# Patient Record
Sex: Male | Born: 1955 | Race: White | Hispanic: No | Marital: Married
Health system: Southern US, Community
[De-identification: ages and names within clinical notes are randomized; demographics above are authoritative.]

## PROBLEM LIST (undated history)

## (undated) DIAGNOSIS — J9621 Acute and chronic respiratory failure with hypoxia: Secondary | ICD-10-CM

## (undated) DIAGNOSIS — J449 Chronic obstructive pulmonary disease, unspecified: Secondary | ICD-10-CM

## (undated) DIAGNOSIS — G4733 Obstructive sleep apnea (adult) (pediatric): Secondary | ICD-10-CM

## (undated) DIAGNOSIS — U071 COVID-19: Secondary | ICD-10-CM

## (undated) DIAGNOSIS — J1282 Pneumonia due to coronavirus disease 2019: Secondary | ICD-10-CM

---

## 2020-05-11 ENCOUNTER — Other Ambulatory Visit (HOSPITAL_COMMUNITY): Payer: Medicare Other

## 2020-05-11 ENCOUNTER — Inpatient Hospital Stay
Admission: AD | Admit: 2020-05-11 | Discharge: 2020-06-09 | Disposition: A | Discharge status: Skilled Nursing Facility | Payer: Medicare Other | Source: Other Acute Inpatient Hospital | Attending: Internal Medicine | Admitting: Internal Medicine

## 2020-05-11 DIAGNOSIS — J449 Chronic obstructive pulmonary disease, unspecified: Secondary | ICD-10-CM | POA: Diagnosis present

## 2020-05-11 DIAGNOSIS — Z4659 Encounter for fitting and adjustment of other gastrointestinal appliance and device: Secondary | ICD-10-CM

## 2020-05-11 DIAGNOSIS — G4733 Obstructive sleep apnea (adult) (pediatric): Secondary | ICD-10-CM | POA: Diagnosis present

## 2020-05-11 DIAGNOSIS — U071 COVID-19: Secondary | ICD-10-CM | POA: Diagnosis present

## 2020-05-11 DIAGNOSIS — J189 Pneumonia, unspecified organism: Secondary | ICD-10-CM

## 2020-05-11 DIAGNOSIS — L0291 Cutaneous abscess, unspecified: Secondary | ICD-10-CM

## 2020-05-11 DIAGNOSIS — J9621 Acute and chronic respiratory failure with hypoxia: Secondary | ICD-10-CM | POA: Diagnosis present

## 2020-05-11 DIAGNOSIS — Z431 Encounter for attention to gastrostomy: Secondary | ICD-10-CM

## 2020-05-11 HISTORY — DX: Acute and chronic respiratory failure with hypoxia: J96.21

## 2020-05-11 HISTORY — DX: Pneumonia due to coronavirus disease 2019: J12.82

## 2020-05-11 HISTORY — DX: COVID-19: U07.1

## 2020-05-11 HISTORY — DX: Chronic obstructive pulmonary disease, unspecified: J44.9

## 2020-05-11 HISTORY — DX: Obstructive sleep apnea (adult) (pediatric): G47.33

## 2020-05-12 ENCOUNTER — Encounter: Payer: Self-pay | Admitting: Internal Medicine

## 2020-05-12 DIAGNOSIS — U071 COVID-19: Secondary | ICD-10-CM | POA: Diagnosis not present

## 2020-05-12 DIAGNOSIS — G4733 Obstructive sleep apnea (adult) (pediatric): Secondary | ICD-10-CM | POA: Diagnosis not present

## 2020-05-12 DIAGNOSIS — J1282 Pneumonia due to coronavirus disease 2019: Secondary | ICD-10-CM

## 2020-05-12 DIAGNOSIS — J449 Chronic obstructive pulmonary disease, unspecified: Secondary | ICD-10-CM | POA: Diagnosis not present

## 2020-05-12 DIAGNOSIS — J9621 Acute and chronic respiratory failure with hypoxia: Secondary | ICD-10-CM | POA: Diagnosis present

## 2020-05-12 LAB — CBC WITH DIFFERENTIAL/PLATELET
Abs Immature Granulocytes: 0.21 10*3/uL — ABNORMAL HIGH (ref 0.00–0.07)
Basophils Absolute: 0 10*3/uL (ref 0.0–0.1)
Basophils Relative: 1 %
Eosinophils Absolute: 0.2 10*3/uL (ref 0.0–0.5)
Eosinophils Relative: 4 %
HCT: 28.1 % — ABNORMAL LOW (ref 39.0–52.0)
Hemoglobin: 8.6 g/dL — ABNORMAL LOW (ref 13.0–17.0)
Immature Granulocytes: 4 %
Lymphocytes Relative: 24 %
Lymphs Abs: 1.4 10*3/uL (ref 0.7–4.0)
MCH: 27.9 pg (ref 26.0–34.0)
MCHC: 30.6 g/dL (ref 30.0–36.0)
MCV: 91.2 fL (ref 80.0–100.0)
Monocytes Absolute: 0.5 10*3/uL (ref 0.1–1.0)
Monocytes Relative: 9 %
Neutro Abs: 3.4 10*3/uL (ref 1.7–7.7)
Neutrophils Relative %: 58 %
Platelets: 225 10*3/uL (ref 150–400)
RBC: 3.08 MIL/uL — ABNORMAL LOW (ref 4.22–5.81)
RDW: 13.6 % (ref 11.5–15.5)
WBC: 5.8 10*3/uL (ref 4.0–10.5)
nRBC: 0 % (ref 0.0–0.2)

## 2020-05-12 LAB — BLOOD GAS, ARTERIAL
Acid-Base Excess: 4 mmol/L — ABNORMAL HIGH (ref 0.0–2.0)
Bicarbonate: 28.3 mmol/L — ABNORMAL HIGH (ref 20.0–28.0)
FIO2: 35
O2 Saturation: 95.6 %
Patient temperature: 37
pCO2 arterial: 44.4 mmHg (ref 32.0–48.0)
pH, Arterial: 7.42 (ref 7.350–7.450)
pO2, Arterial: 74.3 mmHg — ABNORMAL LOW (ref 83.0–108.0)

## 2020-05-12 LAB — COMPREHENSIVE METABOLIC PANEL
ALT: 39 U/L (ref 0–44)
AST: 14 U/L — ABNORMAL LOW (ref 15–41)
Albumin: 2.7 g/dL — ABNORMAL LOW (ref 3.5–5.0)
Alkaline Phosphatase: 44 U/L (ref 38–126)
Anion gap: 8 (ref 5–15)
BUN: 26 mg/dL — ABNORMAL HIGH (ref 8–23)
CO2: 30 mmol/L (ref 22–32)
Calcium: 9.4 mg/dL (ref 8.9–10.3)
Chloride: 104 mmol/L (ref 98–111)
Creatinine, Ser: 1.13 mg/dL (ref 0.61–1.24)
GFR calc Af Amer: >60 mL/min (ref >60)
GFR calc non Af Amer: >60 mL/min (ref >60)
Glucose, Bld: 248 mg/dL — ABNORMAL HIGH (ref 70–99)
Potassium: 4.5 mmol/L (ref 3.5–5.1)
Sodium: 142 mmol/L (ref 135–145)
Total Bilirubin: 0.5 mg/dL (ref 0.3–1.2)
Total Protein: 5.9 g/dL — ABNORMAL LOW (ref 6.5–8.1)

## 2020-05-12 LAB — HEMOGLOBIN A1C
Hgb A1c MFr Bld: 8 % — ABNORMAL HIGH (ref 4.8–5.6)
Mean Plasma Glucose: 182.9 mg/dL

## 2020-05-12 NOTE — Consult Note (Signed)
Pulmonary Critical Care Medicine [facility] [place]  PULMONARY SERVICE  Date of Service: 05/12/2020  PULMONARY CRITICAL CARE Donald Spence  QIW:[id number]  DOB: 10/23/1955   DOA: 05/11/2020  Referring Physician: Carron Curie, MD  HPI: Donald Spence is a 64 y.o. male seen for follow up of Acute on Chronic Respiratory Failure.  Patient has multiple medical problems including obstructive sleep apnea morbid obesity moderate COPD DJD who presented to the hospital because of increasing shortness of breath.  Patient was diagnosed with COVID-19 and admitted to the hospital.  Patient had a complicated course was intubated extubated several times but did not tolerate apparently had up to 4 intubations based on the discharge summary so therefore tracheostomy was done.  We will need to proceed slowly therefore.  Patient also apparently developed sepsis and was treated with vancomycin.  Patient is now transferred to our facility for further management and weaning  Review of Systems:  ROS performed and is unremarkable other than noted above.  Past Medical History:  Diagnosis Date  . Anxiety  . COPD (chronic obstructive pulmonary disease) (*)  . Coronary artery disease  . Diverticulitis  . Hyperlipidemia  . Hypertension  . 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 Sleep apnea  . Type II or unspecified type diabetes mellitus without mention of complication, uncontrolled  History of Diabetes Mellitus Poorly Controlled  . Unspecified nasal polyp  History of Nasal Polyps   Past Surgical History:  Procedure Laterality Date  . Cardiac catheterization  . Cervical fusion 10/16/2012  C3-7 ACDF @ [facility] Dr. Tyrone Spence  . Coronary artery bypass graft 04/11/2018  CABG (CORONARY ARTERY BYPASS GRAFT) x 2, IMA, [facility] - Dr. Belva Spence  . Eye surgery Bilateral  cataracts  . Hemorrhoid surgery  . Left l5-s1 discectomy via metrx Left 02/03/2017  [place], MD @ [facility] (Left L5-s1 discectomy via MetRx)  . Other surgical  history  History of Nose Surgery  . Other surgical history  History of Tonsillectomy  . Other surgical history  History of Coronary Angiography, W/O Concomitant Left Heart Catheteriz  . Tonsillectomy   Social History   Socioeconomic History  . Marital status: Widowed  Spouse name: Not on file  . Number of children: 3  . Years of education: [employer]  . Highest education level: Not on file  Occupational History  . Occupation: physical labor  Tobacco Use  . Smoking status: Never Smoker  . Smokeless tobacco: Current User  . Tobacco comment: chew  Vaping Use  . Vaping Use: Never used  Substance and Sexual Activity  . Alcohol use: Not Currently  . Drug use: Not on file  . Sexual activity: Not on file  Other Topics Concern  . Not on file  Social History Narrative  Marital History - His wife died Friday, 26-Feb-2017 for metastatic carcinoma. She died 5 days after his 16 12/28/16 lumbar surgery.   Family History  Problem Relation Age of Onset  . Other Other  Family history of Hyperlipidemia  . Other Other  Family history of Cancer  . Heart attack Mother  . Cancer Father   Allergies  Allergen Reactions  . Bee Venom Nausea And Vomiting, Swelling and Other  sweating     Medications: Reviewed on Rounds  Physical Exam:  Vitals: Temperature is 98.9 pulse 70 respiratory rate 17 blood pressure is 152/78 saturations 100%  Ventilator Settings on T-bar at this time on wean protocol  . General: Comfortable at this time . Eyes: Grossly  normal lids, irises & conjunctiva . ENT: grossly tongue is normal . Neck: no obvious mass . Cardiovascular: S1-S2 normal no gallop or rub . Respiratory: No rhonchi no rales are noted at this time . Abdomen: Soft nontender . Skin: no rash seen on limited exam . Musculoskeletal: not rigid . Psychiatric:unable to assess . Neurologic: no seizure no involuntary movements         Labs on Admission:  Basic Metabolic Panel: No results for  input(s): NA, K, CL, CO2, GLUCOSE, BUN, CREATININE, CALCIUM, MG, PHOS in the last 168 hours.  No results for input(s): PHART, PCO2ART, PO2ART, HCO3, O2SAT in the last 168 hours.  Liver Function Tests: No results for input(s): AST, ALT, ALKPHOS, BILITOT, PROT, ALBUMIN in the last 168 hours. No results for input(s): LIPASE, AMYLASE in the last 168 hours. No results for input(s): AMMONIA in the last 168 hours.  CBC: No results for input(s): WBC, NEUTROABS, HGB, HCT, MCV, PLT in the last 168 hours.  Cardiac Enzymes: No results for input(s): CKTOTAL, CKMB, CKMBINDEX, TROPONINI in the last 168 hours.  BNP (last 3 results) No results for input(s): BNP in the last 8760 hours.  ProBNP (last 3 results) No results for input(s): PROBNP in the last 8760 hours.   Radiological Exams on Admission: DG CHEST PORT 1 VIEW  Result Date: 05/11/2020 CLINICAL DATA:  COVID positive.  NG placement EXAM: PORTABLE CHEST 1 VIEW COMPARISON:  None. FINDINGS: Tracheostomy in good position. Left arm PICC tip in the right atrium. Prior median sternotomy. Heart size upper normal without heart failure. Mild patchy bilateral airspace disease left greater than right. No significant effusion. IMPRESSION: Mild patchy bibasilar airspace disease left greater than right most likely pneumonia. Tracheostomy in good position. PICC tip in the right atrium. Electronically Signed   By: Donald Spence M.D.   On: 05/11/2020 15:01   DG Abd Portable 1V  Result Date: 05/11/2020 CLINICAL DATA:  Enteric tube placement EXAM: PORTABLE ABDOMEN - 1 VIEW COMPARISON:  None. FINDINGS: Feeding tube courses through the stomach and into the duodenum with the tip in the proximal jejunum. Normal bowel gas pattern. IMPRESSION: Feeding tube tip in the proximal jejunum. Electronically Signed   By: Donald Spence M.D.   On: 05/11/2020 15:02    Assessment/Plan Active Problems:   Acute on chronic respiratory failure with hypoxia (HCC)   COVID-19 virus  infection   Pneumonia due to COVID-19 virus   COPD, severe (HCC)   Obstructive sleep apnea   1. Acute on chronic respiratory failure with hypoxia patient will be continued on the weaning protocol appears to be tolerating it well.  We will continue to advance. 2. COVID-19 virus infection in resolution phase we will continue to monitor closely. 3. COVID-19 pneumonia has been treated we will continue to follow up on chest x-rays.  The patient still has some airspace disease noted as a residual. 4. Severe COPD will need nebulizers as necessary.  We will continue to monitor closely. 5. Sleep apnea nonissue at this time patient is with a tracheostomy but once patient is able to will need Pap therapy  I have personally seen and evaluated the patient, evaluated laboratory and imaging results, formulated the assessment and plan and placed orders. The Patient requires high complexity decision making with multiple systems involvement.  Case was discussed on Rounds with the Respiratory Therapy Director and the Respiratory staff Time Spent  Yevonne Pax, MD [facility] Pulmonary Critical Care Medicine Sleep Medicine

## 2020-05-13 DIAGNOSIS — U071 COVID-19: Secondary | ICD-10-CM | POA: Diagnosis not present

## 2020-05-13 DIAGNOSIS — J449 Chronic obstructive pulmonary disease, unspecified: Secondary | ICD-10-CM | POA: Diagnosis not present

## 2020-05-13 DIAGNOSIS — G4733 Obstructive sleep apnea (adult) (pediatric): Secondary | ICD-10-CM | POA: Diagnosis not present

## 2020-05-13 DIAGNOSIS — J9621 Acute and chronic respiratory failure with hypoxia: Secondary | ICD-10-CM | POA: Diagnosis not present

## 2020-05-13 NOTE — Progress Notes (Addendum)
Pulmonary Critical Care Medicine [facility] [place]   PULMONARY CRITICAL CARE SERVICE  PROGRESS NOTE  Date of Service: 05/13/2020  Donald Spence  [id number]  DOB: 1955/12/28   DOA: 05/11/2020  Referring Physician: Carron Curie, MD  HPI: Donald Spence is a 64 y.o. male seen for follow up of Acute on Chronic Respiratory Failure.  Patient mains on T-bar 35% FiO2 satting well no fever distress.  Medications: Reviewed on Rounds  Physical Exam:  Vitals: Pulse 73 respirations 20 BP 156/80 O2 sat 100% temp 98.2  Ventilator Settings T-bar 35%  . General: Comfortable at this time . Eyes: Grossly normal lids, irises & conjunctiva . ENT: grossly tongue is normal . Neck: no obvious mass . Cardiovascular: S1 S2 normal no gallop . Respiratory: No rales or rhonchi noted . Abdomen: soft . Skin: no rash seen on limited exam . Musculoskeletal: not rigid . Psychiatric:unable to assess . Neurologic: no seizure no involuntary movements         Lab Data:   Basic Metabolic Panel: Recent Labs  Lab 05/12/20 0736  NA 142  K 4.5  CL 104  CO2 30  GLUCOSE 248*  BUN 26*  CREATININE 1.13  CALCIUM 9.4    ABG: Recent Labs  Lab 05/12/20 1505  PHART 7.420  PCO2ART 44.4  PO2ART 74.3*  HCO3 28.3*  O2SAT 95.6    Liver Function Tests: Recent Labs  Lab 05/12/20 0736  AST 14*  ALT 39  ALKPHOS 44  BILITOT 0.5  PROT 5.9*  ALBUMIN 2.7*   No results for input(s): LIPASE, AMYLASE in the last 168 hours. No results for input(s): AMMONIA in the last 168 hours.  CBC: Recent Labs  Lab 05/12/20 0736  WBC 5.8  NEUTROABS 3.4  HGB 8.6*  HCT 28.1*  MCV 91.2  PLT 225    Cardiac Enzymes: No results for input(s): CKTOTAL, CKMB, CKMBINDEX, TROPONINI in the last 168 hours.  BNP (last 3 results) No results for input(s): BNP in the last 8760 hours.  ProBNP (last 3 results) No results for input(s): PROBNP in the last 8760 hours.  Radiological Exams: DG CHEST PORT 1  VIEW  Result Date: 05/11/2020 CLINICAL DATA:  COVID positive.  NG placement EXAM: PORTABLE CHEST 1 VIEW COMPARISON:  None. FINDINGS: Tracheostomy in good position. Left arm PICC tip in the right atrium. Prior median sternotomy. Heart size upper normal without heart failure. Mild patchy bilateral airspace disease left greater than right. No significant effusion. IMPRESSION: Mild patchy bibasilar airspace disease left greater than right most likely pneumonia. Tracheostomy in good position. PICC tip in the right atrium. Electronically Signed   By: Marlan Palau M.D.   On: 05/11/2020 15:01   DG Abd Portable 1V  Result Date: 05/11/2020 CLINICAL DATA:  Enteric tube placement EXAM: PORTABLE ABDOMEN - 1 VIEW COMPARISON:  None. FINDINGS: Feeding tube courses through the stomach and into the duodenum with the tip in the proximal jejunum. Normal bowel gas pattern. IMPRESSION: Feeding tube tip in the proximal jejunum. Electronically Signed   By: Marlan Palau M.D.   On: 05/11/2020 15:02    Assessment/Plan Active Problems:   Acute on chronic respiratory failure with hypoxia (HCC)   COVID-19 virus infection   Pneumonia due to COVID-19 virus   COPD, severe (HCC)   Obstructive sleep apnea   1. Acute on chronic respiratory failure with hypoxia patient will continue to wean as per protocol.  Certainly doing well on 35% aerosol trach collar will continue aggressive pulmonary toilet  supportive measures. 2. COVID-19 virus infection in resolution phase we will continue to monitor closely. 3. COVID-19 pneumonia has been treated we will continue to follow up on chest x-rays.  The patient still has some airspace disease noted as a residual. 4. Severe COPD will need nebulizers as necessary.  We will continue to monitor closely. 5. Sleep apnea nonissue at this time patient is with a tracheostomy but once patient is able to will need Pap therapy   I have personally seen and evaluated the patient, evaluated laboratory  and imaging results, formulated the assessment and plan and placed orders. The Patient requires high complexity decision making with multiple systems involvement.  Rounds were done with the Respiratory Therapy Director and Staff therapists and discussed with nursing staff also.  Yevonne Pax, MD [facility] Pulmonary Critical Care Medicine Sleep Medicine

## 2020-05-14 DIAGNOSIS — G4733 Obstructive sleep apnea (adult) (pediatric): Secondary | ICD-10-CM | POA: Diagnosis not present

## 2020-05-14 DIAGNOSIS — J449 Chronic obstructive pulmonary disease, unspecified: Secondary | ICD-10-CM | POA: Diagnosis not present

## 2020-05-14 DIAGNOSIS — J9621 Acute and chronic respiratory failure with hypoxia: Secondary | ICD-10-CM | POA: Diagnosis not present

## 2020-05-14 DIAGNOSIS — U071 COVID-19: Secondary | ICD-10-CM | POA: Diagnosis not present

## 2020-05-14 NOTE — Progress Notes (Addendum)
Pulmonary Critical Care Medicine [facility] [place]   PULMONARY CRITICAL CARE SERVICE  PROGRESS NOTE  Date of Service: 05/14/2020  Donald Spence  [id number]  DOB: September 11, 1955   DOA: 05/11/2020  Referring Physician: Carron Curie, MD  HPI: Donald Spence is a 64 y.o. male seen for follow up of Acute on Chronic Respiratory Failure.  Patient mains on 35% T-bar satting well no fever distress.  Medications: Reviewed on Rounds  Physical Exam:  Vitals: Pulse 87 respirations 47 BP 134/74 O2 sat 96% temp 98.6  Ventilator Settings T-bar 35%   General: Comfortable at this time  Eyes: Grossly normal lids, irises & conjunctiva  ENT: grossly tongue is normal  Neck: no obvious mass  Cardiovascular: S1 S2 normal no gallop  Respiratory: No rales or rhonchi noted  Abdomen: soft  Skin: no rash seen on limited exam  Musculoskeletal: not rigid  Psychiatric:unable to assess  Neurologic: no seizure no involuntary movements         Lab Data:   Basic Metabolic Panel: Recent Labs  Lab 05/12/20 0736  NA 142  K 4.5  CL 104  CO2 30  GLUCOSE 248*  BUN 26*  CREATININE 1.13  CALCIUM 9.4    ABG: Recent Labs  Lab 05/12/20 1505  PHART 7.420  PCO2ART 44.4  PO2ART 74.3*  HCO3 28.3*  O2SAT 95.6    Liver Function Tests: Recent Labs  Lab 05/12/20 0736  AST 14*  ALT 39  ALKPHOS 44  BILITOT 0.5  PROT 5.9*  ALBUMIN 2.7*   No results for input(s): LIPASE, AMYLASE in the last 168 hours. No results for input(s): AMMONIA in the last 168 hours.  CBC: Recent Labs  Lab 05/12/20 0736  WBC 5.8  NEUTROABS 3.4  HGB 8.6*  HCT 28.1*  MCV 91.2  PLT 225    Cardiac Enzymes: No results for input(s): CKTOTAL, CKMB, CKMBINDEX, TROPONINI in the last 168 hours.  BNP (last 3 results) No results for input(s): BNP in the last 8760 hours.  ProBNP (last 3 results) No results for input(s): PROBNP in the last 8760 hours.  Radiological Exams: No results  found.  Assessment/Plan Active Problems:   Acute on chronic respiratory failure with hypoxia (HCC)   COVID-19 virus infection   Pneumonia due to COVID-19 virus   COPD, severe (HCC)   Obstructive sleep apnea   1. Acute on chronic respiratory failure with hypoxia  plan is for patient to continue to wean on 35% T-bar we will continue supportive measures and pulmonary toilet at this time. 2. COVID-19 virus infection in resolution phase we will continue to monitor closely. 3. COVID-19 pneumonia has been treated we will continue to follow up on chest x-rays. The patient still has some airspace disease noted as a residual. 4. Severe COPD will need nebulizers as necessary. We will continue to monitor closely. 5. Sleep apnea nonissue at this time patient is with a tracheostomy but once patient is able to will need Pap therapy   I have personally seen and evaluated the patient, evaluated laboratory and imaging results, formulated the assessment and plan and placed orders. The Patient requires high complexity decision making with multiple systems involvement.  Rounds were done with the Respiratory Therapy Director and Staff therapists and discussed with nursing staff also.  Yevonne Pax, MD [facility] Pulmonary Critical Care Medicine Sleep Medicine

## 2020-05-15 DIAGNOSIS — J449 Chronic obstructive pulmonary disease, unspecified: Secondary | ICD-10-CM | POA: Diagnosis not present

## 2020-05-15 DIAGNOSIS — U071 COVID-19: Secondary | ICD-10-CM | POA: Diagnosis not present

## 2020-05-15 DIAGNOSIS — J9621 Acute and chronic respiratory failure with hypoxia: Secondary | ICD-10-CM | POA: Diagnosis not present

## 2020-05-15 DIAGNOSIS — G4733 Obstructive sleep apnea (adult) (pediatric): Secondary | ICD-10-CM | POA: Diagnosis not present

## 2020-05-15 NOTE — Progress Notes (Signed)
Pulmonary Critical Care Medicine [facility] [place]   PULMONARY CRITICAL CARE SERVICE  PROGRESS NOTE  Date of Service: 05/15/2020  Donald Spence  [id number]  DOB: Sep 06, 1955   DOA: 05/11/2020  Referring Physician: Carron Curie, MD  HPI: Donald Spence is a 64 y.o. male seen for follow up of Acute on Chronic Respiratory Failure.  Patient is comfortable right now without distress at this time has been on T-bar weans  Medications: Reviewed on Rounds  Physical Exam:  Vitals: Temperature 97.4 pulse 80 respiratory 24 blood pressure is 101/61 saturations 100%  Ventilator Settings on T-bar weans   General: Comfortable at this time  Eyes: Grossly normal lids, irises & conjunctiva  ENT: grossly tongue is normal  Neck: no obvious mass  Cardiovascular: S1 S2 normal no gallop  Respiratory: No rhonchi very coarse breath sounds  Abdomen: soft  Skin: no rash seen on limited exam  Musculoskeletal: not rigid  Psychiatric:unable to assess  Neurologic: no seizure no involuntary movements         Lab Data:   Basic Metabolic Panel: Recent Labs  Lab 05/12/20 0736  NA 142  K 4.5  CL 104  CO2 30  GLUCOSE 248*  BUN 26*  CREATININE 1.13  CALCIUM 9.4    ABG: Recent Labs  Lab 05/12/20 1505  PHART 7.420  PCO2ART 44.4  PO2ART 74.3*  HCO3 28.3*  O2SAT 95.6    Liver Function Tests: Recent Labs  Lab 05/12/20 0736  AST 14*  ALT 39  ALKPHOS 44  BILITOT 0.5  PROT 5.9*  ALBUMIN 2.7*   No results for input(s): LIPASE, AMYLASE in the last 168 hours. No results for input(s): AMMONIA in the last 168 hours.  CBC: Recent Labs  Lab 05/12/20 0736  WBC 5.8  NEUTROABS 3.4  HGB 8.6*  HCT 28.1*  MCV 91.2  PLT 225    Cardiac Enzymes: No results for input(s): CKTOTAL, CKMB, CKMBINDEX, TROPONINI in the last 168 hours.  BNP (last 3 results) No results for input(s): BNP in the last 8760 hours.  ProBNP (last 3 results) No results for input(s): PROBNP in the  last 8760 hours.  Radiological Exams: No results found.  Assessment/Plan Active Problems:   Acute on chronic respiratory failure with hypoxia (HCC)   COVID-19 virus infection   Pneumonia due to COVID-19 virus   COPD, severe (HCC)   Obstructive sleep apnea   1. Acute on chronic respiratory failure with hypoxia we will continue with weaning antibiotics tolerated 2. COVID-19 virus infection in recovery phase 3. Pneumonia due to COVID-19 clinically is improving 4. Severe COPD at baseline 5. Obstructive sleep apnea nonissue at this time on T-bar   I have personally seen and evaluated the patient, evaluated laboratory and imaging results, formulated the assessment and plan and placed orders. The Patient requires high complexity decision making with multiple systems involvement.  Rounds were done with the Respiratory Therapy Director and Staff therapists and discussed with nursing staff also.  Yevonne Pax, MD [facility] Pulmonary Critical Care Medicine Sleep Medicine

## 2020-05-16 DIAGNOSIS — U071 COVID-19: Secondary | ICD-10-CM | POA: Diagnosis not present

## 2020-05-16 DIAGNOSIS — J449 Chronic obstructive pulmonary disease, unspecified: Secondary | ICD-10-CM | POA: Diagnosis not present

## 2020-05-16 DIAGNOSIS — G4733 Obstructive sleep apnea (adult) (pediatric): Secondary | ICD-10-CM | POA: Diagnosis not present

## 2020-05-16 DIAGNOSIS — J9621 Acute and chronic respiratory failure with hypoxia: Secondary | ICD-10-CM | POA: Diagnosis not present

## 2020-05-16 LAB — CBC
HCT: 29.2 % — ABNORMAL LOW (ref 39.0–52.0)
Hemoglobin: 9 g/dL — ABNORMAL LOW (ref 13.0–17.0)
MCH: 28 pg (ref 26.0–34.0)
MCHC: 30.8 g/dL (ref 30.0–36.0)
MCV: 90.7 fL (ref 80.0–100.0)
Platelets: 237 10*3/uL (ref 150–400)
RBC: 3.22 MIL/uL — ABNORMAL LOW (ref 4.22–5.81)
RDW: 13.5 % (ref 11.5–15.5)
WBC: 8.3 10*3/uL (ref 4.0–10.5)
nRBC: 0 % (ref 0.0–0.2)

## 2020-05-16 LAB — BASIC METABOLIC PANEL
Anion gap: 10 (ref 5–15)
BUN: 78 mg/dL — ABNORMAL HIGH (ref 8–23)
CO2: 28 mmol/L (ref 22–32)
Calcium: 9.8 mg/dL (ref 8.9–10.3)
Chloride: 105 mmol/L (ref 98–111)
Creatinine, Ser: 1.82 mg/dL — ABNORMAL HIGH (ref 0.61–1.24)
GFR calc Af Amer: 44 mL/min — ABNORMAL LOW (ref >60)
GFR calc non Af Amer: 38 mL/min — ABNORMAL LOW (ref >60)
Glucose, Bld: 267 mg/dL — ABNORMAL HIGH (ref 70–99)
Potassium: 4.5 mmol/L (ref 3.5–5.1)
Sodium: 143 mmol/L (ref 135–145)

## 2020-05-16 NOTE — Progress Notes (Signed)
Pulmonary Critical Care Medicine [facility] [place]   PULMONARY CRITICAL CARE SERVICE  PROGRESS NOTE  Date of Service: 05/16/2020  Donald Spence  [id number]  DOB: 1956-07-04   DOA: 05/11/2020  Referring Physician: Carron Curie, MD  HPI: Donald Spence is a 64 y.o. male seen for follow up of Acute on Chronic Respiratory Failure.  Patient is on T collar currently on 20% FiO2 should be ready for capping trials  Medications: Reviewed on Rounds  Physical Exam:  Vitals: Temperature 97.6 pulse 85 respiratory 19 blood pressure is 151/68 saturations 96%  Ventilator Settings on T collar FiO2 28%  . General: Comfortable at this time . Eyes: Grossly normal lids, irises & conjunctiva . ENT: grossly tongue is normal . Neck: no obvious mass . Cardiovascular: S1 S2 normal no gallop . Respiratory: No rhonchi very coarse breath sounds . Abdomen: soft . Skin: no rash seen on limited exam . Musculoskeletal: not rigid . Psychiatric:unable to assess . Neurologic: no seizure no involuntary movements         Lab Data:   Basic Metabolic Panel: Recent Labs  Lab 05/12/20 0736 05/16/20 0454  NA 142 143  K 4.5 4.5  CL 104 105  CO2 30 28  GLUCOSE 248* 267*  BUN 26* 78*  CREATININE 1.13 1.82*  CALCIUM 9.4 9.8    ABG: Recent Labs  Lab 05/12/20 1505  PHART 7.420  PCO2ART 44.4  PO2ART 74.3*  HCO3 28.3*  O2SAT 95.6    Liver Function Tests: Recent Labs  Lab 05/12/20 0736  AST 14*  ALT 39  ALKPHOS 44  BILITOT 0.5  PROT 5.9*  ALBUMIN 2.7*   No results for input(s): LIPASE, AMYLASE in the last 168 hours. No results for input(s): AMMONIA in the last 168 hours.  CBC: Recent Labs  Lab 05/12/20 0736 05/16/20 0454  WBC 5.8 8.3  NEUTROABS 3.4  --   HGB 8.6* 9.0*  HCT 28.1* 29.2*  MCV 91.2 90.7  PLT 225 237    Cardiac Enzymes: No results for input(s): CKTOTAL, CKMB, CKMBINDEX, TROPONINI in the last 168 hours.  BNP (last 3 results) No results for input(s): BNP  in the last 8760 hours.  ProBNP (last 3 results) No results for input(s): PROBNP in the last 8760 hours.  Radiological Exams: No results found.  Assessment/Plan Active Problems:   Acute on chronic respiratory failure with hypoxia (HCC)   COVID-19 virus infection   Pneumonia due to COVID-19 virus   COPD, severe (HCC)   Obstructive sleep apnea   1. Acute on chronic respiratory failure with hypoxia plan is to continue to advance the weaning trial capping trials today. 2. COVID-19 virus infection in resolution phase 3. Pneumonia due to COVID-19 resolved 4. Severe COPD medical management 5. Obstructive sleep apnea will need to monitor for desaturations once capping is tried   I have personally seen and evaluated the patient, evaluated laboratory and imaging results, formulated the assessment and plan and placed orders. The Patient requires high complexity decision making with multiple systems involvement.  Rounds were done with the Respiratory Therapy Director and Staff therapists and discussed with nursing staff also.  Yevonne Pax, MD [facility] Pulmonary Critical Care Medicine Sleep Medicine

## 2020-05-17 ENCOUNTER — Other Ambulatory Visit (HOSPITAL_COMMUNITY): Payer: Medicare Other

## 2020-05-17 DIAGNOSIS — J9621 Acute and chronic respiratory failure with hypoxia: Secondary | ICD-10-CM | POA: Diagnosis not present

## 2020-05-17 DIAGNOSIS — J449 Chronic obstructive pulmonary disease, unspecified: Secondary | ICD-10-CM | POA: Diagnosis not present

## 2020-05-17 DIAGNOSIS — U071 COVID-19: Secondary | ICD-10-CM | POA: Diagnosis not present

## 2020-05-17 DIAGNOSIS — G4733 Obstructive sleep apnea (adult) (pediatric): Secondary | ICD-10-CM | POA: Diagnosis not present

## 2020-05-17 NOTE — Progress Notes (Signed)
Pulmonary Critical Care Medicine [facility] [place]   PULMONARY CRITICAL CARE SERVICE  PROGRESS NOTE  Date of Service: 05/17/2020  [name]  [id number]  DOB: [birth year]   DOA: 05/11/2020  Referring Physician: Carron Curie, MD  HPI: Donald Spence is a 64 y.o. male seen for follow up of Acute on Chronic Respiratory Failure.  Patient is doing well with capping on 2 L of oxygen should be ready for decannulation  Medications: Reviewed on Rounds  Physical Exam:  Vitals: Temperature 97.2 pulse 79 respiratory rate 21 blood pressure is 135/74 saturations 100%  Ventilator Settings capping on 2 L  . General: Comfortable at this time . Eyes: Grossly normal lids, irises & conjunctiva . ENT: grossly tongue is normal . Neck: no obvious mass . Cardiovascular: S1 S2 normal no gallop . Respiratory: No rhonchi very coarse breath sounds . Abdomen: soft . Skin: no rash seen on limited exam . Musculoskeletal: not rigid . Psychiatric:unable to assess . Neurologic: no seizure no involuntary movements         Lab Data:   Basic Metabolic Panel: Recent Labs  Lab 05/12/20 0736 05/16/20 0454  NA 142 143  K 4.5 4.5  CL 104 105  CO2 30 28  GLUCOSE 248* 267*  BUN 26* 78*  CREATININE 1.13 1.82*  CALCIUM 9.4 9.8    ABG: Recent Labs  Lab 05/12/20 1505  PHART 7.420  PCO2ART 44.4  PO2ART 74.3*  HCO3 28.3*  O2SAT 95.6    Liver Function Tests: Recent Labs  Lab 05/12/20 0736  AST 14*  ALT 39  ALKPHOS 44  BILITOT 0.5  PROT 5.9*  ALBUMIN 2.7*   No results for input(s): LIPASE, AMYLASE in the last 168 hours. No results for input(s): AMMONIA in the last 168 hours.  CBC: Recent Labs  Lab 05/12/20 0736 05/16/20 0454  WBC 5.8 8.3  NEUTROABS 3.4  --   HGB 8.6* 9.0*  HCT 28.1* 29.2*  MCV 91.2 90.7  PLT 225 237    Cardiac Enzymes: No results for input(s): CKTOTAL, CKMB, CKMBINDEX, TROPONINI in the last 168 hours.  BNP (last 3 results) No results for  input(s): BNP in the last 8760 hours.  ProBNP (last 3 results) No results for input(s): PROBNP in the last 8760 hours.  Radiological Exams: No results found.  Assessment/Plan Active Problems:   Acute on chronic respiratory failure with hypoxia (HCC)   COVID-19 virus infection   Pneumonia due to COVID-19 virus   COPD, severe (HCC)   Obstructive sleep apnea   1. Acute on chronic respiratory failure hypoxia we will continue with capping towards eventual decannulation. 2. COVID-19 virus infection in resolution 3. Pneumonia due to COVID-19 treated we will continue to monitor 4. Severe COPD medical management 5. Obstructive sleep apnea he has been doing well actually with the capping   I have personally seen and evaluated the patient, evaluated laboratory and imaging results, formulated the assessment and plan and placed orders. The Patient requires high complexity decision making with multiple systems involvement.  Rounds were done with the Respiratory Therapy Director and Staff therapists and discussed with nursing staff also.  Yevonne Pax, MD [facility] Pulmonary Critical Care Medicine Sleep Medicine

## 2020-05-18 DIAGNOSIS — G4733 Obstructive sleep apnea (adult) (pediatric): Secondary | ICD-10-CM | POA: Diagnosis not present

## 2020-05-18 DIAGNOSIS — U071 COVID-19: Secondary | ICD-10-CM | POA: Diagnosis not present

## 2020-05-18 DIAGNOSIS — J449 Chronic obstructive pulmonary disease, unspecified: Secondary | ICD-10-CM | POA: Diagnosis not present

## 2020-05-18 DIAGNOSIS — J9621 Acute and chronic respiratory failure with hypoxia: Secondary | ICD-10-CM | POA: Diagnosis not present

## 2020-05-18 NOTE — Progress Notes (Signed)
Pulmonary Critical Care Medicine [facility] [place]   PULMONARY CRITICAL CARE SERVICE  PROGRESS NOTE  Date of Service: 05/18/2020  Donald Spence  [id number]:[id number]  DOB: 1955-09-30   DOA: 05/11/2020  Referring Physician: Carron Curie, MD  HPI: Donald Spence is a 64 y.o. male seen for follow up of Acute on Chronic Respiratory Failure.  Patient currently is capping has been doing fairly well.  He appears to be in no distress  Medications: Reviewed on Rounds  Physical Exam:  Vitals: Temperature is 98.5 pulse 77 respiratory 25 blood pressure is 105/72 saturations 98%  Ventilator Settings capping off the ventilator  . General: Comfortable at this time . Eyes: Grossly normal lids, irises & conjunctiva . ENT: grossly tongue is normal . Neck: no obvious mass . Cardiovascular: S1 S2 normal no gallop . Respiratory: No rhonchi no rales are noted at this time . Abdomen: soft . Skin: no rash seen on limited exam . Musculoskeletal: not rigid . Psychiatric:unable to assess . Neurologic: no seizure no involuntary movements         Lab Data:   Basic Metabolic Panel: Recent Labs  Lab 05/12/20 0736 05/16/20 0454  NA 142 143  K 4.5 4.5  CL 104 105  CO2 30 28  GLUCOSE 248* 267*  BUN 26* 78*  CREATININE 1.13 1.82*  CALCIUM 9.4 9.8    ABG: Recent Labs  Lab 05/12/20 1505  PHART 7.420  PCO2ART 44.4  PO2ART 74.3*  HCO3 28.3*  O2SAT 95.6    Liver Function Tests: Recent Labs  Lab 05/12/20 0736  AST 14*  ALT 39  ALKPHOS 44  BILITOT 0.5  PROT 5.9*  ALBUMIN 2.7*   No results for input(s): LIPASE, AMYLASE in the last 168 hours. No results for input(s): AMMONIA in the last 168 hours.  CBC: Recent Labs  Lab 05/12/20 0736 05/16/20 0454  WBC 5.8 8.3  NEUTROABS 3.4  --   HGB 8.6* 9.0*  HCT 28.1* 29.2*  MCV 91.2 90.7  PLT 225 237    Cardiac Enzymes: No results for input(s): CKTOTAL, CKMB, CKMBINDEX, TROPONINI in the last 168 hours.  BNP (last 3 results) No  results for input(s): BNP in the last 8760 hours.  ProBNP (last 3 results) No results for input(s): PROBNP in the last 8760 hours.  Radiological Exams: No results found.  Assessment/Plan Active Problems:   Acute on chronic respiratory failure with hypoxia (HCC)   COVID-19 virus infection   Pneumonia due to COVID-19 virus   COPD, severe (HCC)   Obstructive sleep apnea   1. Acute on chronic respiratory failure hypoxia plan is to proceed with decannulation tomorrow 2. COVID-19 virus infection resolving continue with supportive care 3. Pneumonia due to COVID-19 treated clinically improved 4. Severe COPD at baseline medical management 5. Obstructive sleep apnea may need positive airway pressure we will continue to monitor closely   I have personally seen and evaluated the patient, evaluated laboratory and imaging results, formulated the assessment and plan and placed orders. The Patient requires high complexity decision making with multiple systems involvement.  Rounds were done with the Respiratory Therapy Director and Staff therapists and discussed with nursing staff also.  Yevonne Pax, MD [facility] Pulmonary Critical Care Medicine Sleep Medicine

## 2020-05-19 DIAGNOSIS — J449 Chronic obstructive pulmonary disease, unspecified: Secondary | ICD-10-CM | POA: Diagnosis not present

## 2020-05-19 DIAGNOSIS — J9621 Acute and chronic respiratory failure with hypoxia: Secondary | ICD-10-CM | POA: Diagnosis not present

## 2020-05-19 DIAGNOSIS — U071 COVID-19: Secondary | ICD-10-CM | POA: Diagnosis not present

## 2020-05-19 DIAGNOSIS — G4733 Obstructive sleep apnea (adult) (pediatric): Secondary | ICD-10-CM | POA: Diagnosis not present

## 2020-05-19 LAB — CBC
HCT: 27.7 % — ABNORMAL LOW (ref 39.0–52.0)
Hemoglobin: 9.1 g/dL — ABNORMAL LOW (ref 13.0–17.0)
MCH: 29.4 pg (ref 26.0–34.0)
MCHC: 32.9 g/dL (ref 30.0–36.0)
MCV: 89.6 fL (ref 80.0–100.0)
Platelets: 206 10*3/uL (ref 150–400)
RBC: 3.09 MIL/uL — ABNORMAL LOW (ref 4.22–5.81)
RDW: 13.7 % (ref 11.5–15.5)
WBC: 5.9 10*3/uL (ref 4.0–10.5)
nRBC: 0 % (ref 0.0–0.2)

## 2020-05-19 LAB — BASIC METABOLIC PANEL
Anion gap: 8 (ref 5–15)
BUN: 58 mg/dL — ABNORMAL HIGH (ref 8–23)
CO2: 28 mmol/L (ref 22–32)
Calcium: 8.8 mg/dL — ABNORMAL LOW (ref 8.9–10.3)
Chloride: 100 mmol/L (ref 98–111)
Creatinine, Ser: 1.57 mg/dL — ABNORMAL HIGH (ref 0.61–1.24)
GFR calc Af Amer: 53 mL/min — ABNORMAL LOW (ref >60)
GFR calc non Af Amer: 46 mL/min — ABNORMAL LOW (ref >60)
Glucose, Bld: 215 mg/dL — ABNORMAL HIGH (ref 70–99)
Potassium: 4.7 mmol/L (ref 3.5–5.1)
Sodium: 136 mmol/L (ref 135–145)

## 2020-05-19 NOTE — Progress Notes (Signed)
Pulmonary Critical Care Medicine [facility] [place]   PULMONARY CRITICAL CARE SERVICE  PROGRESS NOTE  Date of Service: 05/19/2020  Donald Spence  [id number]  DOB: 1955-12-09   DOA: 05/11/2020  Referring Physician: Carron Curie, MD  HPI: Donald Spence is a 64 y.o. male seen for follow up of Acute on Chronic Respiratory Failure.  Patient is doing well today ready for decannulation  Medications: Reviewed on Rounds  Physical Exam:  Vitals: Temperature is 97.7 pulse 74 respiratory 23 blood pressure is 127/72 saturations 99%  Ventilator Settings decannulate today  . General: Comfortable at this time . Eyes: Grossly normal lids, irises & conjunctiva . ENT: grossly tongue is normal . Neck: no obvious mass . Cardiovascular: S1 S2 normal no gallop . Respiratory: No rhonchi no rales . Abdomen: soft . Skin: no rash seen on limited exam . Musculoskeletal: not rigid . Psychiatric:unable to assess . Neurologic: no seizure no involuntary movements         Lab Data:   Basic Metabolic Panel: Recent Labs  Lab 05/16/20 0454  NA 143  K 4.5  CL 105  CO2 28  GLUCOSE 267*  BUN 78*  CREATININE 1.82*  CALCIUM 9.8    ABG: Recent Labs  Lab 05/12/20 1505  PHART 7.420  PCO2ART 44.4  PO2ART 74.3*  HCO3 28.3*  O2SAT 95.6    Liver Function Tests: No results for input(s): AST, ALT, ALKPHOS, BILITOT, PROT, ALBUMIN in the last 168 hours. No results for input(s): LIPASE, AMYLASE in the last 168 hours. No results for input(s): AMMONIA in the last 168 hours.  CBC: Recent Labs  Lab 05/16/20 0454  WBC 8.3  HGB 9.0*  HCT 29.2*  MCV 90.7  PLT 237    Cardiac Enzymes: No results for input(s): CKTOTAL, CKMB, CKMBINDEX, TROPONINI in the last 168 hours.  BNP (last 3 results) No results for input(s): BNP in the last 8760 hours.  ProBNP (last 3 results) No results for input(s): PROBNP in the last 8760 hours.  Radiological Exams: No results  found.  Assessment/Plan Active Problems:   Acute on chronic respiratory failure with hypoxia (HCC)   COVID-19 virus infection   Pneumonia due to COVID-19 virus   COPD, severe (HCC)   Obstructive sleep apnea   1. Acute on chronic respiratory failure hypoxia plan is to proceed to decannulation as discussed. 2. COVID-19 virus infection in recovery continue supportive care 3. Pneumonia due to COVID-19 treated clinically improved 4. Severe COPD at baseline 5. Obstructive sleep apnea with we will need to monitor closely for any desaturations   I have personally seen and evaluated the patient, evaluated laboratory and imaging results, formulated the assessment and plan and placed orders. The Patient requires high complexity decision making with multiple systems involvement.  Rounds were done with the Respiratory Therapy Director and Staff therapists and discussed with nursing staff also.  Yevonne Pax, MD [facility] Pulmonary Critical Care Medicine Sleep Medicine

## 2020-05-20 DIAGNOSIS — U071 COVID-19: Secondary | ICD-10-CM | POA: Diagnosis not present

## 2020-05-20 DIAGNOSIS — J9621 Acute and chronic respiratory failure with hypoxia: Secondary | ICD-10-CM | POA: Diagnosis not present

## 2020-05-20 DIAGNOSIS — G4733 Obstructive sleep apnea (adult) (pediatric): Secondary | ICD-10-CM | POA: Diagnosis not present

## 2020-05-20 DIAGNOSIS — J449 Chronic obstructive pulmonary disease, unspecified: Secondary | ICD-10-CM | POA: Diagnosis not present

## 2020-05-20 LAB — BASIC METABOLIC PANEL
Anion gap: 7 (ref 5–15)
BUN: 43 mg/dL — ABNORMAL HIGH (ref 8–23)
CO2: 27 mmol/L (ref 22–32)
Calcium: 8.8 mg/dL — ABNORMAL LOW (ref 8.9–10.3)
Chloride: 100 mmol/L (ref 98–111)
Creatinine, Ser: 1.43 mg/dL — ABNORMAL HIGH (ref 0.61–1.24)
GFR calc Af Amer: 59 mL/min — ABNORMAL LOW (ref >60)
GFR calc non Af Amer: 51 mL/min — ABNORMAL LOW (ref >60)
Glucose, Bld: 250 mg/dL — ABNORMAL HIGH (ref 70–99)
Potassium: 4.7 mmol/L (ref 3.5–5.1)
Sodium: 134 mmol/L — ABNORMAL LOW (ref 135–145)

## 2020-05-20 LAB — CBC
HCT: 25.6 % — ABNORMAL LOW (ref 39.0–52.0)
Hemoglobin: 8.6 g/dL — ABNORMAL LOW (ref 13.0–17.0)
MCH: 29.7 pg (ref 26.0–34.0)
MCHC: 33.6 g/dL (ref 30.0–36.0)
MCV: 88.3 fL (ref 80.0–100.0)
Platelets: 178 10*3/uL (ref 150–400)
RBC: 2.9 MIL/uL — ABNORMAL LOW (ref 4.22–5.81)
RDW: 13.5 % (ref 11.5–15.5)
WBC: 6.3 10*3/uL (ref 4.0–10.5)
nRBC: 0 % (ref 0.0–0.2)

## 2020-05-20 NOTE — Progress Notes (Addendum)
Pulmonary Critical Care Medicine [facility] [place]   PULMONARY CRITICAL CARE SERVICE  PROGRESS NOTE  Date of Service: 05/20/2020  Donald Spence  [id number]  DOB: 1955/12/28   DOA: 05/11/2020  Referring Physician: Carron Curie, MD  HPI: Donald Spence is a 64 y.o. male seen for follow up of Acute on Chronic Respiratory Failure.  Patient remains decannulated on room air satting well no fever distress.  Medications: Reviewed on Rounds  Physical Exam:  Vitals: Pulse 85 respirations 22 BP 131/77 O2 sat 98% temp 98.0  Ventilator Settings room air   General: Comfortable at this time  Eyes: Grossly normal lids, irises & conjunctiva  ENT: grossly tongue is normal  Neck: no obvious mass  Cardiovascular: S1 S2 normal no gallop  Respiratory: No rales or rhonchi noted  Abdomen: soft  Skin: no rash seen on limited exam  Musculoskeletal: not rigid  Psychiatric:unable to assess  Neurologic: no seizure no involuntary movements         Lab Data:   Basic Metabolic Panel: Recent Labs  Lab 05/16/20 0454 05/19/20 1014 05/20/20 1329  NA 143 136 134*  K 4.5 4.7 4.7  CL 105 100 100  CO2 28 28 27   GLUCOSE 267* 215* 250*  BUN 78* 58* 43*  CREATININE 1.82* 1.57* 1.43*  CALCIUM 9.8 8.8* 8.8*    ABG: No results for input(s): PHART, PCO2ART, PO2ART, HCO3, O2SAT in the last 168 hours.  Liver Function Tests: No results for input(s): AST, ALT, ALKPHOS, BILITOT, PROT, ALBUMIN in the last 168 hours. No results for input(s): LIPASE, AMYLASE in the last 168 hours. No results for input(s): AMMONIA in the last 168 hours.  CBC: Recent Labs  Lab 05/16/20 0454 05/19/20 1014 05/20/20 1329  WBC 8.3 5.9 6.3  HGB 9.0* 9.1* 8.6*  HCT 29.2* 27.7* 25.6*  MCV 90.7 89.6 88.3  PLT 237 206 178    Cardiac Enzymes: No results for input(s): CKTOTAL, CKMB, CKMBINDEX, TROPONINI in the last 168 hours.  BNP (last 3 results) No results for input(s): BNP in the last 8760  hours.  ProBNP (last 3 results) No results for input(s): PROBNP in the last 8760 hours.  Radiological Exams: No results found.  Assessment/Plan Active Problems:   Acute on chronic respiratory failure with hypoxia (HCC)   COVID-19 virus infection   Pneumonia due to COVID-19 virus   COPD, severe (HCC)   Obstructive sleep apnea   1. Acute on chronic respiratory failure hypoxia patient is decannulated on room air currently we will continue supportive measures. 2. COVID-19 virus infection in recovery continue supportive care 3. Pneumonia due to COVID-19 treated clinically improved 4. Severe COPD at baseline 5. Obstructive sleep apnea with we will need to monitor closely for any desaturations   I have personally seen and evaluated the patient, evaluated laboratory and imaging results, formulated the assessment and plan and placed orders. The Patient requires high complexity decision making with multiple systems involvement.  Rounds were done with the Respiratory Therapy Director and Staff therapists and discussed with nursing staff also.  05/22/20, MD [facility] Pulmonary Critical Care Medicine Sleep Medicine

## 2020-05-22 LAB — URINALYSIS, ROUTINE W REFLEX MICROSCOPIC
Bilirubin Urine: NEGATIVE
Glucose, UA: NEGATIVE mg/dL
Hgb urine dipstick: NEGATIVE
Ketones, ur: NEGATIVE mg/dL
Leukocytes,Ua: NEGATIVE
Nitrite: NEGATIVE
Protein, ur: NEGATIVE mg/dL
Specific Gravity, Urine: 1.008 (ref 1.005–1.030)
pH: 5 (ref 5.0–8.0)

## 2020-05-22 LAB — GRAM STAIN

## 2020-05-23 LAB — CBC
HCT: 25 % — ABNORMAL LOW (ref 39.0–52.0)
Hemoglobin: 8.1 g/dL — ABNORMAL LOW (ref 13.0–17.0)
MCH: 28.3 pg (ref 26.0–34.0)
MCHC: 32.4 g/dL (ref 30.0–36.0)
MCV: 87.4 fL (ref 80.0–100.0)
Platelets: 168 10*3/uL (ref 150–400)
RBC: 2.86 MIL/uL — ABNORMAL LOW (ref 4.22–5.81)
RDW: 14.1 % (ref 11.5–15.5)
WBC: 7.3 10*3/uL (ref 4.0–10.5)
nRBC: 0 % (ref 0.0–0.2)

## 2020-05-23 LAB — BASIC METABOLIC PANEL
Anion gap: 7 (ref 5–15)
BUN: 52 mg/dL — ABNORMAL HIGH (ref 8–23)
CO2: 28 mmol/L (ref 22–32)
Calcium: 9.5 mg/dL (ref 8.9–10.3)
Chloride: 100 mmol/L (ref 98–111)
Creatinine, Ser: 1.38 mg/dL — ABNORMAL HIGH (ref 0.61–1.24)
GFR calc Af Amer: >60 mL/min (ref >60)
GFR calc non Af Amer: 53 mL/min — ABNORMAL LOW (ref >60)
Glucose, Bld: 203 mg/dL — ABNORMAL HIGH (ref 70–99)
Potassium: 5 mmol/L (ref 3.5–5.1)
Sodium: 135 mmol/L (ref 135–145)

## 2020-05-23 LAB — URINE CULTURE: Culture: <10000 — AB

## 2020-05-24 ENCOUNTER — Other Ambulatory Visit (HOSPITAL_COMMUNITY): Payer: Medicare Other

## 2020-05-26 LAB — BASIC METABOLIC PANEL
Anion gap: 8 (ref 5–15)
BUN: 40 mg/dL — ABNORMAL HIGH (ref 8–23)
CO2: 24 mmol/L (ref 22–32)
Calcium: 8.7 mg/dL — ABNORMAL LOW (ref 8.9–10.3)
Chloride: 99 mmol/L (ref 98–111)
Creatinine, Ser: 1.17 mg/dL (ref 0.61–1.24)
GFR calc Af Amer: >60 mL/min (ref >60)
GFR calc non Af Amer: >60 mL/min (ref >60)
Glucose, Bld: 215 mg/dL — ABNORMAL HIGH (ref 70–99)
Potassium: 4.7 mmol/L (ref 3.5–5.1)
Sodium: 131 mmol/L — ABNORMAL LOW (ref 135–145)

## 2020-05-26 LAB — CBC
HCT: 23 % — ABNORMAL LOW (ref 39.0–52.0)
Hemoglobin: 7.6 g/dL — ABNORMAL LOW (ref 13.0–17.0)
MCH: 29.1 pg (ref 26.0–34.0)
MCHC: 33 g/dL (ref 30.0–36.0)
MCV: 88.1 fL (ref 80.0–100.0)
Platelets: 182 10*3/uL (ref 150–400)
RBC: 2.61 MIL/uL — ABNORMAL LOW (ref 4.22–5.81)
RDW: 14.5 % (ref 11.5–15.5)
WBC: 9.2 10*3/uL (ref 4.0–10.5)
nRBC: 0 % (ref 0.0–0.2)

## 2020-05-26 NOTE — Consult Note (Signed)
Infectious Disease Consultation   Donald Spence  [id number]  DOB: [birth year]  DOA: 05/11/2020  Requesting physician: Dr. Manson Passey  Reason for consultation: Antibiotic recommendations  History of Present Illness: Donald Spence is an 64 y.o. male with medical history significant of coronary artery disease, COPD, chronic hypoxemic respiratory failure was admitted to [facility] on 04/26/2020 from [facility].  He reportedly presented to [facility] on 04/06/2020.  He was diagnosed with Covid infection on 04/03/2020.  Patient presented to the emergency room with hypoxemia.  Not sure what treatment he received for Covid.  Patient however required intubation initially, later extubated but required reintubation due to worsening respiratory failure.  He apparently had a mucous plug with worsening respiratory failure and therefore required intubation.  Patient at the outside facility underwent PSV trials.  He underwent tracheostomy placement on 05/03/2020.  He was treated with vancomycin and meropenem for MRSA, Enterococcus pneumonia and MRSA bacteremia.  Due to his complex medical problems he was transferred to [facility].  Patient currently decannulated, on oxygen by nasal cannula.  He is complaining of some chills, mild shortness of breath, mild cough, denies sputum.  Patient also noted to have fever of 101.  Denies having any chest pain, nausea, vomiting, abdominal pain, diarrhea or dysuria.  Review of Systems:  Review of systems negative except as mentioned above in the HPI.  Past Medical History: Coronary artery disease involving native coronary artery of native heart without angina pectoris 05/21/2018  . 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 Obesity (BMI 30-39.9) 04/07/2018  . Hyperglycemia 04/07/2018  . COPD, moderate (*) 04/01/2018  . Precordial pain 04/01/2018  . Chronic hypoxemic respiratory failure (*) 04/01/2018  . OSA (obstructive sleep  apnea) 04/01/2018  . S/P right coronary artery (RCA) stent placement 04/01/2018  . Statin intolerance 10/14/2017  . SOB (shortness of breath) 03/22/2017  . left L5-S1 HNP 02/03/2017  . Degeneration of lumbar or lumbosacral intervertebral disc 07/25/2009  Class: Chronic  Lumbar Disc Degeneration  Dyslipidemia associated with type 2 diabetes mellitus (*) 07/25/2009  Class: Chronic  Type 2 Diabetes Mellitus  . Seborrheic dermatitis, unspecified 07/25/2009  Class: Chronic  Seborrheic Dermatitis  . Unspecified hereditary and idiopathic peripheral neuropathy 07/25/2009  Class: Chronic  Peripheral Neuropathy  10/1 IMO update  . Unspecified essential hypertension 07/25/2009  Class: Chronic  Hypertension    Past Surgical History: C3-C7 ACDF, CABG, cataract surgery, hemorrhoid surgery, L5-S1 discectomy, History of Coronary Angiography, W/O Concomitant Left Heart Catheteriz  . Tonsillectomy, CABG 04/11/2018   Allergies: Allergic to bee venom  Social History: Denies any history of smoking, alcohol abuse or recreational drug abuse  Family History: History of hyperlipidemia, cancer, heart attack in mother  Physical Exam: Vitals: T-max 101.1, pulse 91, respiratory rate 21, blood pressure 129/61, pulse oximetry 99% on oxygen nasal cannula Constitutional: Awake, oriented x3, not in any acute distress. Eyes: PERLA, EOMI  ENMT: external ears and nose appear normal, normal hearing, Lips appears normal, moist oral mucosa.  Neck: Supple, no masses CVS: S1-S2 Respiratory: Rhonchi, no wheezing Abdomen: soft nontender, nondistended, normal bowel sounds Musculoskeletal: No edema Neuro: Cranial nerves II-XII intact, grossly nonfocal Psych: stable mood and affect, mental status Skin: no rashes  Data reviewed:  I have personally reviewed following labs and imaging studies Labs:  CBC: Recent Labs  Lab 05/20/20 1329 05/23/20 0921 05/26/20 0930  WBC 6.3 7.3 9.2  HGB 8.6* 8.1*  7.6*   HCT 25.6* 25.0* 23.0*  MCV 88.3 87.4 88.1  PLT 178 168 182    Basic Metabolic Panel: Recent Labs  Lab 05/20/20 1329 05/20/20 1329 05/23/20 0921 05/26/20 0930  NA 134*  --  135 131*  K 4.7   < > 5.0 4.7  CL 100  --  100 99  CO2 27  --  28 24  GLUCOSE 250*  --  203* 215*  BUN 43*  --  52* 40*  CREATININE 1.43*  --  1.38* 1.17  CALCIUM 8.8*  --  9.5 8.7*   < > = values in this interval not displayed.   GFR CrCl cannot be calculated (Unknown ideal weight.). Liver Function Tests: No results for input(s): AST, ALT, ALKPHOS, BILITOT, PROT, ALBUMIN in the last 168 hours. No results for input(s): LIPASE, AMYLASE in the last 168 hours. No results for input(s): AMMONIA in the last 168 hours. Coagulation profile No results for input(s): INR, PROTIME in the last 168 hours.  Cardiac Enzymes: No results for input(s): CKTOTAL, CKMB, CKMBINDEX, TROPONINI in the last 168 hours. BNP: Invalid input(s): POCBNP CBG: No results for input(s): GLUCAP in the last 168 hours. D-Dimer No results for input(s): DDIMER in the last 72 hours. Hgb A1c No results for input(s): HGBA1C in the last 72 hours. Lipid Profile No results for input(s): CHOL, HDL, LDLCALC, TRIG, CHOLHDL, LDLDIRECT in the last 72 hours. Thyroid function studies No results for input(s): TSH, T4TOTAL, T3FREE, THYROIDAB in the last 72 hours.  Invalid input(s): FREET3 Anemia work up No results for input(s): VITAMINB12, FOLATE, FERRITIN, TIBC, IRON, RETICCTPCT in the last 72 hours. Urinalysis    Component Value Date/Time   COLORURINE STRAW (A) 05/22/2020 1600   APPEARANCEUR CLEAR 05/22/2020 1600   LABSPEC 1.008 05/22/2020 1600   PHURINE 5.0 05/22/2020 1600   GLUCOSEU NEGATIVE 05/22/2020 1600   HGBUR NEGATIVE 05/22/2020 1600   BILIRUBINUR NEGATIVE 05/22/2020 1600   KETONESUR NEGATIVE 05/22/2020 1600   PROTEINUR NEGATIVE 05/22/2020 1600   NITRITE NEGATIVE 05/22/2020 1600   LEUKOCYTESUR NEGATIVE 05/22/2020 1600      Microbiology Recent Results (from the past 240 hour(s))  Culture, Urine     Status: Abnormal   Collection Time: 05/22/20  2:27 PM   Specimen: Urine, Random  Result Value Ref Range Status   Specimen Description URINE, RANDOM  Final   Special Requests NONE  Final   Culture (A)  Final    <10,000 COLONIES/mL INSIGNIFICANT GROWTH Performed at [facility], [address] [place], [place] [address]    Report Status 05/23/2020 FINAL  Final  Gram stain     Status: None   Collection Time: 05/22/20  2:27 PM   Specimen: Urine, Catheterized  Result Value Ref Range Status   Specimen Description URINE, CATHETERIZED  Final   Special Requests NONE  Final   Gram Stain   Final    CYTOSPIN SMEAR NO WBC SEEN GRAM POSITIVE COCCI Performed at [facility], [address] [place], [place] [address]    Report Status 05/22/2020 FINAL  Final  Culture, blood (routine x 2)     Status: None (Preliminary result)   Collection Time: 05/22/20  2:45 PM   Specimen: BLOOD RIGHT ARM  Result Value Ref Range Status   Specimen Description BLOOD RIGHT ARM  Final   Special Requests   Final    BOTTLES DRAWN AEROBIC ONLY Blood Culture results may not be optimal due to an inadequate volume of  blood received in culture bottles   Culture   Final    NO GROWTH 4 DAYS Performed at [facility], [address], [place], [place] [address]    Report Status PENDING  Incomplete  Culture, blood (routine x 2)     Status: None (Preliminary result)   Collection Time: 05/22/20  3:00 PM   Specimen: BLOOD RIGHT HAND  Result Value Ref Range Status   Specimen Description BLOOD RIGHT HAND  Final   Special Requests   Final    BOTTLES DRAWN AEROBIC ONLY Blood Culture adequate volume   Culture   Final    NO GROWTH 4 DAYS Performed at [facility], [address], [place], [place] [address]    Report Status PENDING  Incomplete     Inpatient Medications:   Please see MAR  Radiological Exams on  Admission: No results found.  Impression/Recommendations Active Problems:   Acute on chronic respiratory failure with hypoxia  Systemic inflammatory response syndrome Fever  COVID-19 virus infection   Pneumonia due to COVID-19 virus Sacrococcygeal pressure ulcer unstageable MRSA bacteremia Pneumonia with Enterococcus Diabetes mellitus type 2, poorly controlled Acute kidney injury COPD, severe   Obstructive sleep apnea Dysphagia/malnutrition    Acute on chronic hypoxemic respiratory failure: He has history of COPD, obstructive sleep apnea.  He was admitted at the acute facility secondary to COVID-19 infection with pneumonia.  He subsequently had secondary bacterial pneumonia with Enterococcus.  Previously intubated but not decannulated. He remains on oxygen by nasal cannula.  He completed treatment with IV vancomycin, meropenem at the outside facility.  However, here he had fevers therefore restarted on empiric IV vancomycin, cefepime.  He has mild cough but has rhonchi on auscultation.  He also has dysphagia and at risk for aspiration.  Continue to monitor.  If his respiratory status is worsening, suggest CT of the chest to better evaluate.  Systemic inflammatory response syndrome: Patient having fevers.  Etiology for the fever is unclear at this time.  He is at risk for aspiration due to his dysphagia.  He also has sacrococcygeal pressure ulcer unstageable.  Currently on empiric IV vancomycin, cefepime.  Blood cultures did not show any growth to date.  He previously had MRSA bacteremia at the outside facility for which she was treated with IV vancomycin.  If he continues to have fevers suggest CT of the chest/abdomen/pelvis to evaluate for infectious etiology.  Concern for underlying abscess from the sacrococcygeal pressure ulcer versus osteomyelitis.  Fever: Etiology?  Blood cultures, urine cultures from here did not show any growth.  On empiric IV vancomycin, cefepime.  If he continues to  have fevers suggest CT of the chest/abdomen/pelvis to evaluate for infectious etiology. Please monitor BUN/cr closely while on antibiotics.   COVID-19 infection/pneumonia: Patient had COVID-19 infection and subsequent secondary bacterial pneumonia.  Already treated with IV vancomycin, meropenem at outside facility.  Now restarted on IV vancomycin, cefepime for ongoing fevers, systemic inflammatory response syndrome.  Antibiotics and plan as mentioned above.  Sacrococcygeal pressure ulcer unstageable: Continue local wound care.  However, he is having fevers etiology unclear.  On empiric antibiotics as mentioned above.  Suggest CT imaging to evaluate.  Acute kidney injury: Continue to monitor BUN/cr closely.  Losartan on hold by the primary team.  Avoid nephrotoxic medication.  Further management per the primary team.  Diabetes mellitus type 2: Blood glucose continues to be high.  Continue medications and management of diabetes per the primary team.  COPD/obstructive  sleep apnea: Continue management per the primary team.  Dysphagia/malnutrition: Speech therapy, dietitian following.  Further management per primary team.  Due to his complex medical problems he is at risk for worsening and decompensation.  Thank you for this consultation.  Plan of care discussed with the patient and primary team.  Vonzella Nipple M.D. 05/26/2020, 4:59 PM

## 2020-05-27 ENCOUNTER — Other Ambulatory Visit (HOSPITAL_COMMUNITY): Payer: Medicare Other

## 2020-05-27 LAB — CULTURE, BLOOD (ROUTINE X 2)
Culture: NO GROWTH
Culture: NO GROWTH
Special Requests: ADEQUATE

## 2020-05-27 LAB — VANCOMYCIN, TROUGH: Vancomycin Tr: 9 ug/mL — ABNORMAL LOW (ref 15–20)

## 2020-05-27 MED ORDER — IOHEXOL 300 MG/ML  SOLN
100.0000 mL | Freq: Once | INTRAMUSCULAR | Status: AC | PRN
  Administered 2020-05-27: 100 mL via INTRAVENOUS

## 2020-05-28 ENCOUNTER — Other Ambulatory Visit (HOSPITAL_COMMUNITY): Payer: Medicare Other

## 2020-05-28 LAB — BASIC METABOLIC PANEL
Anion gap: 11 (ref 5–15)
BUN: 37 mg/dL — ABNORMAL HIGH (ref 8–23)
CO2: 25 mmol/L (ref 22–32)
Calcium: 8.7 mg/dL — ABNORMAL LOW (ref 8.9–10.3)
Chloride: 100 mmol/L (ref 98–111)
Creatinine, Ser: 1.12 mg/dL (ref 0.61–1.24)
GFR calc Af Amer: >60 mL/min (ref >60)
GFR calc non Af Amer: >60 mL/min (ref >60)
Glucose, Bld: 156 mg/dL — ABNORMAL HIGH (ref 70–99)
Potassium: 4 mmol/L (ref 3.5–5.1)
Sodium: 136 mmol/L (ref 135–145)

## 2020-05-28 LAB — CBC
HCT: 21 % — ABNORMAL LOW (ref 39.0–52.0)
HCT: 23.7 % — ABNORMAL LOW (ref 39.0–52.0)
Hemoglobin: 6.9 g/dL — CL (ref 13.0–17.0)
Hemoglobin: 7.8 g/dL — ABNORMAL LOW (ref 13.0–17.0)
MCH: 28.8 pg (ref 26.0–34.0)
MCH: 28.6 pg (ref 26.0–34.0)
MCHC: 32.9 g/dL (ref 30.0–36.0)
MCHC: 32.9 g/dL (ref 30.0–36.0)
MCV: 87.5 fL (ref 80.0–100.0)
MCV: 86.8 fL (ref 80.0–100.0)
Platelets: 169 10*3/uL (ref 150–400)
Platelets: 170 10*3/uL (ref 150–400)
RBC: 2.4 MIL/uL — ABNORMAL LOW (ref 4.22–5.81)
RBC: 2.73 MIL/uL — ABNORMAL LOW (ref 4.22–5.81)
RDW: 14.7 % (ref 11.5–15.5)
RDW: 15 % (ref 11.5–15.5)
WBC: 7.2 10*3/uL (ref 4.0–10.5)
WBC: 6.7 10*3/uL (ref 4.0–10.5)
nRBC: 0 % (ref 0.0–0.2)
nRBC: 0 % (ref 0.0–0.2)

## 2020-05-28 LAB — ABO/RH: ABO/RH(D): A POS

## 2020-05-28 LAB — PREPARE RBC (CROSSMATCH)

## 2020-05-29 LAB — CBC
HCT: 25.6 % — ABNORMAL LOW (ref 39.0–52.0)
Hemoglobin: 8.1 g/dL — ABNORMAL LOW (ref 13.0–17.0)
MCH: 27.6 pg (ref 26.0–34.0)
MCHC: 31.6 g/dL (ref 30.0–36.0)
MCV: 87.4 fL (ref 80.0–100.0)
Platelets: 183 10*3/uL (ref 150–400)
RBC: 2.93 MIL/uL — ABNORMAL LOW (ref 4.22–5.81)
RDW: 15.3 % (ref 11.5–15.5)
WBC: 6 10*3/uL (ref 4.0–10.5)
nRBC: 0 % (ref 0.0–0.2)

## 2020-05-29 LAB — TYPE AND SCREEN
ABO/RH(D): A POS
Antibody Screen: NEGATIVE
Unit division: 0

## 2020-05-29 LAB — VANCOMYCIN, TROUGH: Vancomycin Tr: 27 ug/mL (ref 15–20)

## 2020-05-29 LAB — BPAM RBC
Blood Product Expiration Date: 202110282359
ISSUE DATE / TIME: 202110021315
Unit Type and Rh: 6200

## 2020-06-01 LAB — BASIC METABOLIC PANEL
Anion gap: 10 (ref 5–15)
BUN: 32 mg/dL — ABNORMAL HIGH (ref 8–23)
CO2: 28 mmol/L (ref 22–32)
Calcium: 9.3 mg/dL (ref 8.9–10.3)
Chloride: 99 mmol/L (ref 98–111)
Creatinine, Ser: 1.04 mg/dL (ref 0.61–1.24)
GFR calc non Af Amer: >60 mL/min (ref >60)
Glucose, Bld: 146 mg/dL — ABNORMAL HIGH (ref 70–99)
Potassium: 4.6 mmol/L (ref 3.5–5.1)
Sodium: 137 mmol/L (ref 135–145)

## 2020-06-01 LAB — CBC
HCT: 27.5 % — ABNORMAL LOW (ref 39.0–52.0)
Hemoglobin: 8.9 g/dL — ABNORMAL LOW (ref 13.0–17.0)
MCH: 27.8 pg (ref 26.0–34.0)
MCHC: 32.4 g/dL (ref 30.0–36.0)
MCV: 85.9 fL (ref 80.0–100.0)
Platelets: 167 10*3/uL (ref 150–400)
RBC: 3.2 MIL/uL — ABNORMAL LOW (ref 4.22–5.81)
RDW: 14.9 % (ref 11.5–15.5)
WBC: 6.2 10*3/uL (ref 4.0–10.5)
nRBC: 0 % (ref 0.0–0.2)

## 2020-06-01 LAB — OCCULT BLOOD X 1 CARD TO LAB, STOOL: Fecal Occult Bld: POSITIVE — AB

## 2020-06-06 LAB — CBC
HCT: 26.2 % — ABNORMAL LOW (ref 39.0–52.0)
Hemoglobin: 8.3 g/dL — ABNORMAL LOW (ref 13.0–17.0)
MCH: 27.9 pg (ref 26.0–34.0)
MCHC: 31.7 g/dL (ref 30.0–36.0)
MCV: 88.2 fL (ref 80.0–100.0)
Platelets: 134 10*3/uL — ABNORMAL LOW (ref 150–400)
RBC: 2.97 MIL/uL — ABNORMAL LOW (ref 4.22–5.81)
RDW: 15.8 % — ABNORMAL HIGH (ref 11.5–15.5)
WBC: 6.2 10*3/uL (ref 4.0–10.5)
nRBC: 0 % (ref 0.0–0.2)

## 2020-06-06 LAB — BASIC METABOLIC PANEL
Anion gap: 9 (ref 5–15)
BUN: 23 mg/dL (ref 8–23)
CO2: 29 mmol/L (ref 22–32)
Calcium: 8.6 mg/dL — ABNORMAL LOW (ref 8.9–10.3)
Chloride: 100 mmol/L (ref 98–111)
Creatinine, Ser: 1 mg/dL (ref 0.61–1.24)
GFR, Estimated: >60 mL/min (ref >60)
Glucose, Bld: 256 mg/dL — ABNORMAL HIGH (ref 70–99)
Potassium: 4.7 mmol/L (ref 3.5–5.1)
Sodium: 138 mmol/L (ref 135–145)

## 2021-06-24 IMAGING — CT CT ABD-PELV W/ CM
2 of 5 series · 13 of 36 positions shown, 16 images · IV contrast (Omni 300)
Comparison: None.

CLINICAL DATA: Hypoxemic respiratory failure and fever.

EXAM:
CT CHEST, ABDOMEN, AND PELVIS WITH CONTRAST
TECHNIQUE: Multidetector CT imaging of the chest, abdomen and pelvis was
performed following the standard protocol during bolus
administration of intravenous contrast.
CONTRAST:  100mL OMNIPAQUE IOHEXOL 300 MG/ML  SOLN

[Series 3: cap with 5mm st · axial · 0.94mm/px · z∈[+916,+1451]mm · 10 of 131 slices shown, 13 images]
[im 12/131  mediastinal]
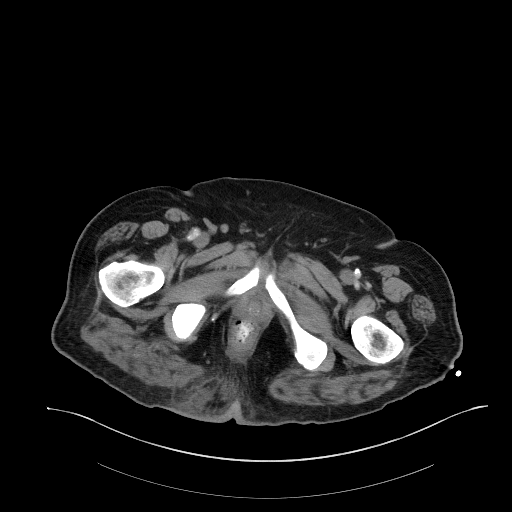
[im 12/131  lung]
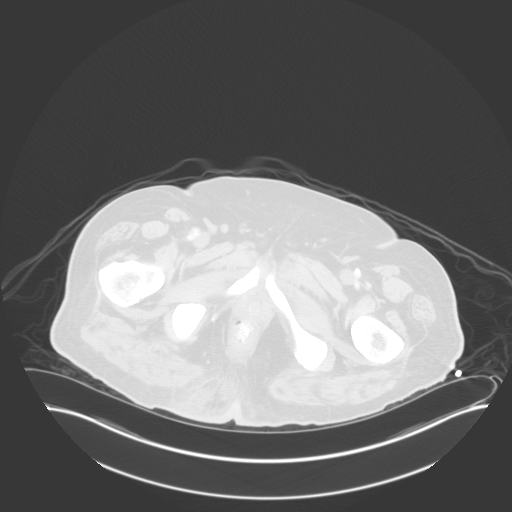
[im 24/131  lung]
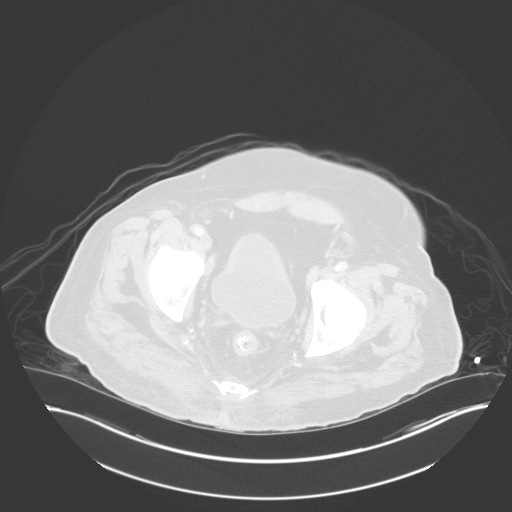
[im 36/131  lung]
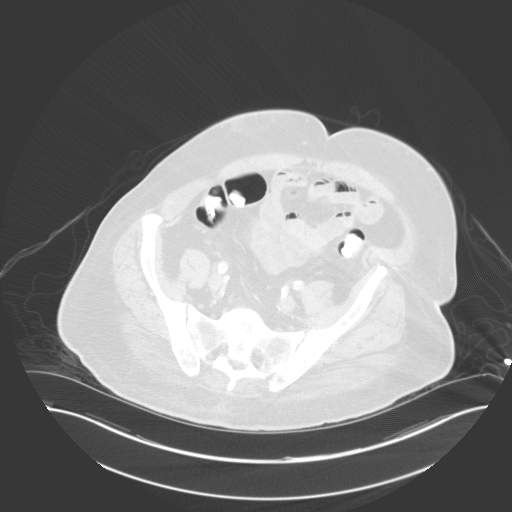
[im 48/131  lung]
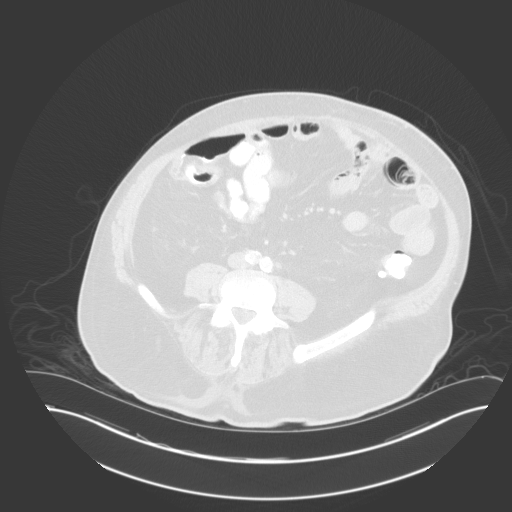
[im 60/131  mediastinal]
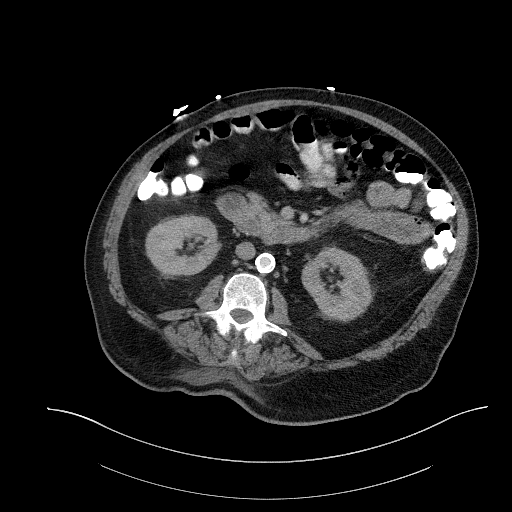
[im 60/131  lung]
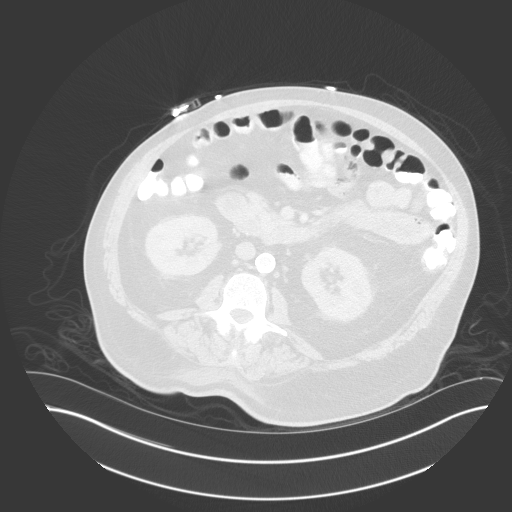
[im 71/131  lung]
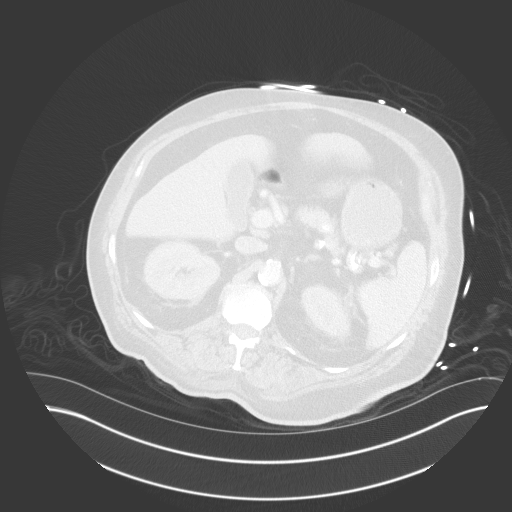
[im 83/131  lung]
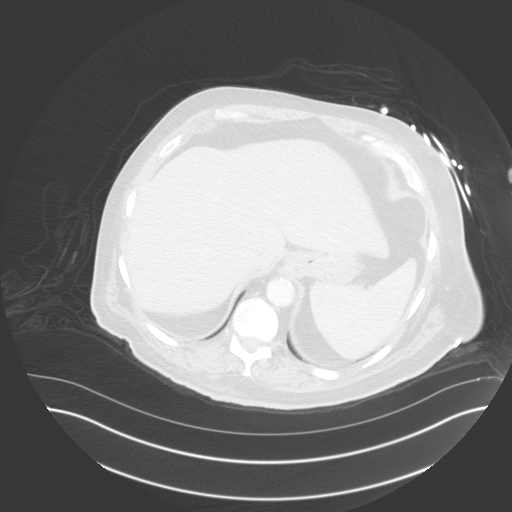
[im 95/131  lung]
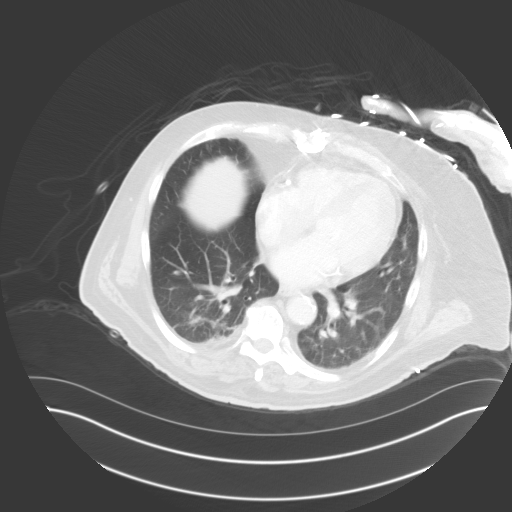
[im 107/131  mediastinal]
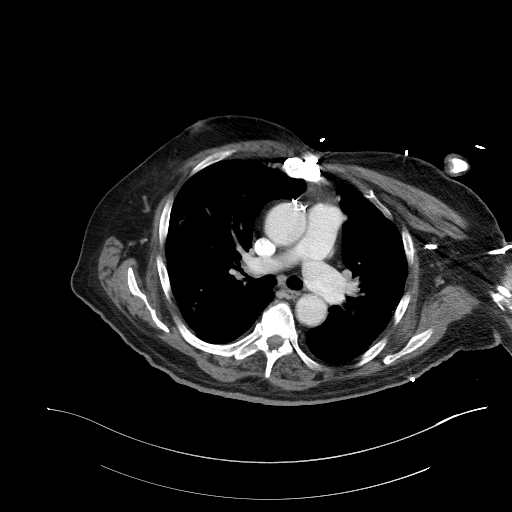
[im 107/131  lung]
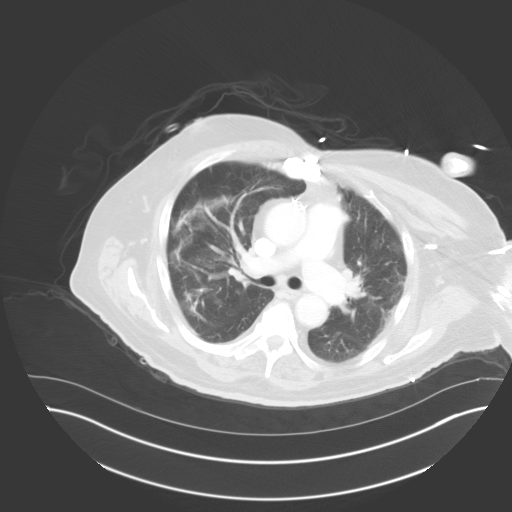
[im 119/131  lung]
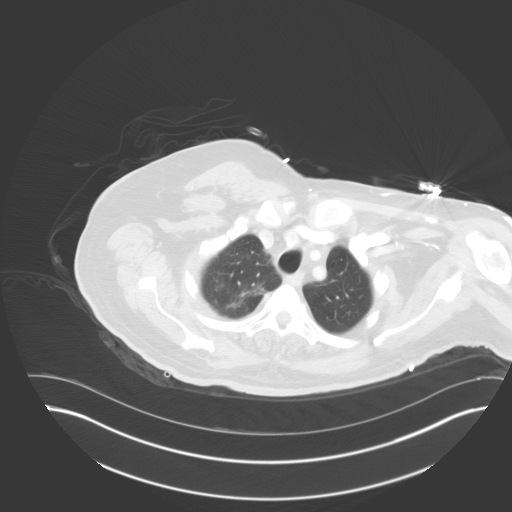

[Series 5: cap with 3mm st cor · coronal · 0.86mm/px · 3 of 179 slices shown]
[im 36/179  lung]
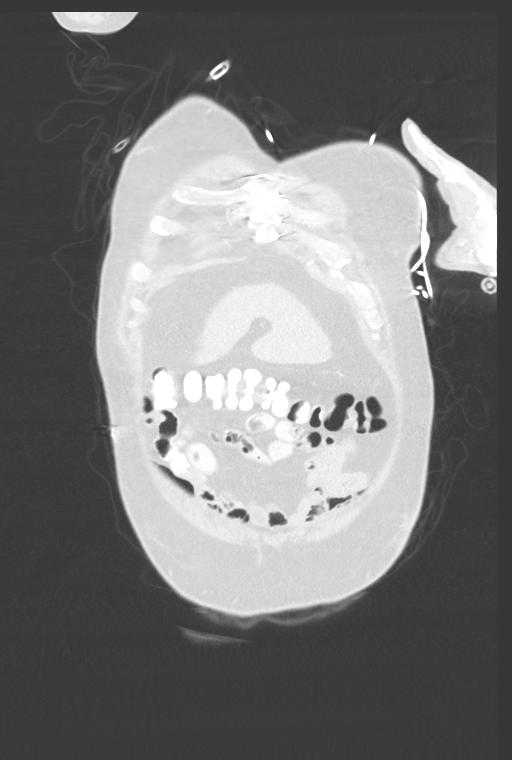
[im 72/179  lung]
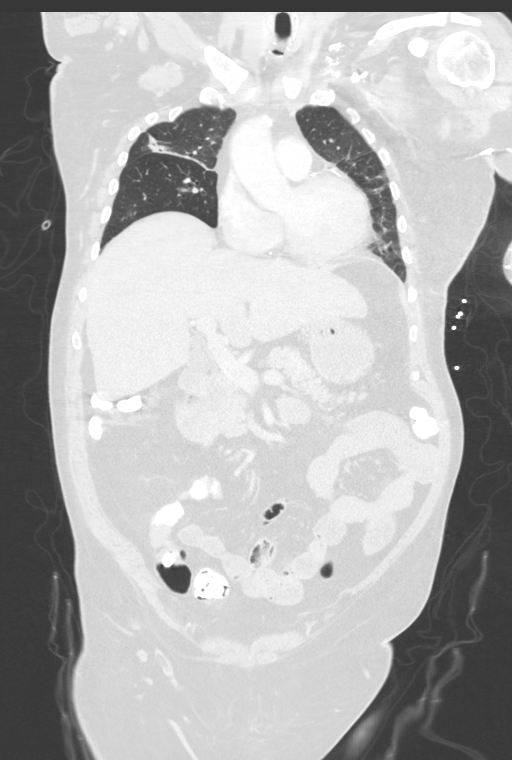
[im 107/179  lung]
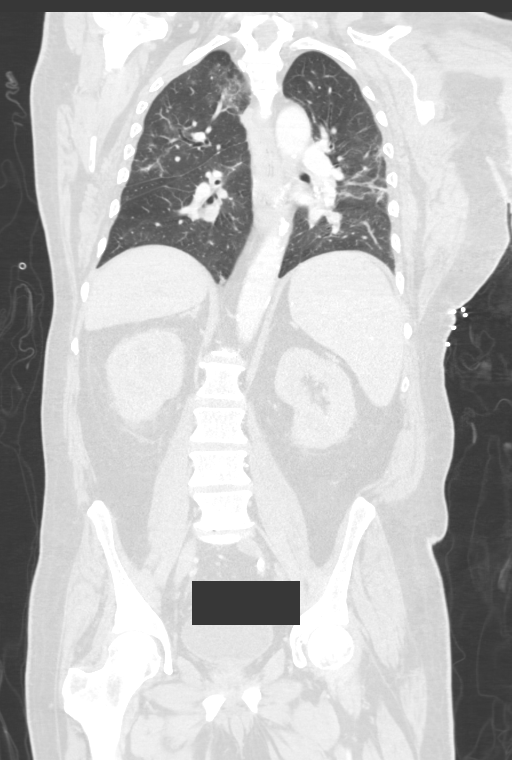

[13 of 36 positions shown; findings below may reference images not displayed]

FINDINGS: CT CHEST FINDINGS

Cardiovascular: Calcific aortic atherosclerosis. Prior CABG. Normal
heart size. No pericardial effusion.

Mediastinum/Nodes: No enlarged mediastinal, hilar, or axillary lymph
nodes. Thyroid gland, trachea, and esophagus demonstrate no
significant findings.

Lungs/Pleura: Multifocal atelectasis and linear opacities. No
pleural effusion or pneumothorax. Calcified granuloma in the right
lower lobe and lingula.

Musculoskeletal: No chest wall mass or suspicious bone lesions
identified.

CT ABDOMEN PELVIS FINDINGS

Hepatobiliary: No focal liver abnormality is seen. No gallstones,
gallbladder wall thickening, or biliary dilatation.

Pancreas: Fluid collection abutting the pancreatic head (series 3,
image 69) is likely a duodenal diverticulum. Pancreas is otherwise
normal.

Spleen: Normal

Adrenals/Urinary Tract: Both adrenal glands are normal. There is no
hydronephrosis or solid renal mass. The urinary bladder is under
distended. No focal abnormality.

Stomach/Bowel: Mild wall thickening of the distal colon is likely
due to underdistention. There is sigmoid diverticulosis. Appendix is
not visualized. As above, suspected duodenal diverticulum adjacent
to the pancreatic head.

Vascular/Lymphatic: Aortic atherosclerosis. No enlarged abdominal or
pelvic lymph nodes.

Reproductive: Prostate is unremarkable.

Other: There is induration of the subcutaneous tissues dorsal to the
sacrum. There is a small gas containing collection (series 3, image
112) near the superior gluteal cleft.

Musculoskeletal: No acute or significant osseous findings.
IMPRESSION: 1. No acute abnormality of the chest, abdomen or pelvis.
2. Induration of the subcutaneous tissues dorsal to the sacrum with
small gas containing collection near the superior gluteal cleft,
consistent with small abscess.
3. Suspected duodenal diverticulum abutting the pancreatic head.
This could be confirmed with an abdominal CT with oral contrast, but
this is not an acute finding. Follow-up with outpatient MRI of the
abdomen with and without contrast could also be considered.

Aortic Atherosclerosis ([id number]).
